# Patient Record
Sex: Female | Born: 1960 | Race: White | Hispanic: No | Marital: Single | Smoking: Never smoker
Health system: Southern US, Community
[De-identification: ages and names within clinical notes are randomized; demographics above are authoritative.]

---

## 1998-08-23 ENCOUNTER — Other Ambulatory Visit: Admission: RE | Admit: 1998-08-23 | Discharge: 1998-08-23 | Payer: Self-pay | Admitting: Obstetrics and Gynecology

## 1999-05-13 ENCOUNTER — Encounter: Admission: RE | Admit: 1999-05-13 | Discharge: 1999-05-13 | Payer: Self-pay | Admitting: Sports Medicine

## 1999-06-13 ENCOUNTER — Encounter: Admission: RE | Admit: 1999-06-13 | Discharge: 1999-06-13 | Payer: Self-pay | Admitting: Sports Medicine

## 1999-09-26 ENCOUNTER — Other Ambulatory Visit: Admission: RE | Admit: 1999-09-26 | Discharge: 1999-09-26 | Payer: Self-pay | Admitting: Obstetrics and Gynecology

## 2001-06-28 ENCOUNTER — Encounter: Admission: RE | Admit: 2001-06-28 | Discharge: 2001-06-28 | Payer: Self-pay | Admitting: Sports Medicine

## 2002-02-11 ENCOUNTER — Encounter: Admission: RE | Admit: 2002-02-11 | Discharge: 2002-02-11 | Payer: Self-pay | Admitting: Sports Medicine

## 2002-09-16 ENCOUNTER — Encounter: Payer: Self-pay | Admitting: Sports Medicine

## 2002-09-16 ENCOUNTER — Encounter: Admission: RE | Admit: 2002-09-16 | Discharge: 2002-09-16 | Payer: Self-pay | Admitting: Sports Medicine

## 2002-10-07 ENCOUNTER — Encounter: Admission: RE | Admit: 2002-10-07 | Discharge: 2002-10-07 | Payer: Self-pay | Admitting: Sports Medicine

## 2002-11-04 ENCOUNTER — Encounter: Admission: RE | Admit: 2002-11-04 | Discharge: 2002-11-04 | Payer: Self-pay | Admitting: Sports Medicine

## 2003-02-10 ENCOUNTER — Encounter: Admission: RE | Admit: 2003-02-10 | Discharge: 2003-02-10 | Payer: Self-pay | Admitting: Sports Medicine

## 2003-07-15 ENCOUNTER — Ambulatory Visit (HOSPITAL_COMMUNITY): Admission: RE | Admit: 2003-07-15 | Discharge: 2003-07-15 | Payer: Self-pay | Admitting: *Deleted

## 2004-09-06 ENCOUNTER — Other Ambulatory Visit: Admission: RE | Admit: 2004-09-06 | Discharge: 2004-09-06 | Payer: Self-pay | Admitting: Family Medicine

## 2004-10-07 ENCOUNTER — Ambulatory Visit: Payer: Self-pay | Admitting: Sports Medicine

## 2005-05-26 ENCOUNTER — Ambulatory Visit: Payer: Self-pay | Admitting: Sports Medicine

## 2005-07-04 ENCOUNTER — Ambulatory Visit: Payer: Self-pay | Admitting: Sports Medicine

## 2006-01-26 ENCOUNTER — Ambulatory Visit: Payer: Self-pay | Admitting: Sports Medicine

## 2007-12-04 ENCOUNTER — Telehealth: Payer: Self-pay | Admitting: *Deleted

## 2008-01-22 ENCOUNTER — Ambulatory Visit: Payer: Self-pay | Admitting: Sports Medicine

## 2008-01-22 DIAGNOSIS — M25559 Pain in unspecified hip: Secondary | ICD-10-CM | POA: Insufficient documentation

## 2008-01-22 DIAGNOSIS — M5430 Sciatica, unspecified side: Secondary | ICD-10-CM

## 2008-01-24 ENCOUNTER — Telehealth (INDEPENDENT_AMBULATORY_CARE_PROVIDER_SITE_OTHER): Payer: Self-pay | Admitting: *Deleted

## 2009-08-16 ENCOUNTER — Ambulatory Visit: Payer: Self-pay | Admitting: Sports Medicine

## 2010-02-16 ENCOUNTER — Ambulatory Visit: Payer: Self-pay | Admitting: Sports Medicine

## 2010-02-16 DIAGNOSIS — M79609 Pain in unspecified limb: Secondary | ICD-10-CM

## 2010-07-08 ENCOUNTER — Ambulatory Visit: Admission: RE | Admit: 2010-07-08 | Discharge: 2010-07-08 | Payer: Self-pay | Admitting: Orthopedic Surgery

## 2010-07-08 ENCOUNTER — Ambulatory Visit: Payer: Self-pay | Admitting: Vascular Surgery

## 2010-07-08 ENCOUNTER — Encounter (INDEPENDENT_AMBULATORY_CARE_PROVIDER_SITE_OTHER): Payer: Self-pay | Admitting: Orthopedic Surgery

## 2010-11-17 ENCOUNTER — Other Ambulatory Visit
Admission: RE | Admit: 2010-11-17 | Discharge: 2010-11-17 | Payer: Self-pay | Source: Home / Self Care | Admitting: Family Medicine

## 2011-01-05 NOTE — Assessment & Plan Note (Signed)
Summary: 4PM APPT/ R CALF PAIN   Vital Signs:  Patient profile:   50 year old female BP sitting:   117 / 81  Vitals Entered By: Lillia Pauls CMA (February 16, 2010 4:04 PM)  History of Present Illness: Training for marathon for past several months. Ran 25 miles weekly at the time. Insidious onset of non-radiating mid-right calf pain 5 months ago.  Stopped running for a few weeks. Resume running for weekly mileage of 25. Experiences pain  ~11 miles into run.  No back/groin/thigh/ankle/foot pain. N paresthesias/incontinence. Still taking neurontin (600mg  once daily?; does not recall dose) for hx of back pain. Will compete in marathon in 4 days.   PMH-FH-SH reviewed for relevance  Physical Exam  General:  Well-developed,well-nourished,in no acute distress; alert,appropriate and cooperative throughout examination Msk:  BACK: FROM without pain. No ttp or apparent deformity.  HIPS/KNEES/ANKLES/FEET: Full ROM/strength. No calf atrophy/swelling/discoloration. Pain and spasm of mid-lateral right calf, of a few seconds duration, on plantarflexion against resistance. Equivocal re: respective ttp. No fib ttp/deformity. 2+ DTR except for 1+ right PT DTR. Intact sensation.  * No limp. Pulses:  2+ pt/dp pulses. Neurologic:  Negative sitting/supine SLR bilaterally. Additional Exam:  Musculoskeletal Ultrasound: Longitudinal and transverse views of the right calf revealed the following:  Normal fibula head. Intact musculature without apparent masses, defects, or abnormal fluid collection.    Impression & Recommendations:  Problem # 1:  CALF PAIN (ICD-729.5) Picture most consistent with calf strain. Lumbar radiculopathy less likely.  - body helix calf sleeve only during exericse. - Daily eceentric exercises, demonstrated to the patient. - voltaren gel. - Use her own gel heel cups in running shoes. - rtc in 6 wks.   Orders: Garment,belt,sleeve or other covering ,elastic or  similar stretch (E4540)  Complete Medication List: 1)  Neurontin 600 Mg Tabs (Gabapentin) .... Take one tablet twice a day 2)  Voltaren 1 % Gel (Diclofenac sodium) .Marland Kitchen.. 1 gm into affected area qid  Other Orders: [place] LIMITED ([id number]) Prescriptions: VOLTAREN 1 % GEL (DICLOFENAC SODIUM) 1 gm into affected area QID  #100 gm x 1   Entered and Authorized by:   Valarie Merino MD   Signed by:   Valarie Merino MD on 02/16/2010   Method used:   Print then Give to Patient   RxID:   [id number]

## 2011-12-10 ENCOUNTER — Emergency Department (HOSPITAL_COMMUNITY)
Admission: EM | Admit: 2011-12-10 | Discharge: 2011-12-10 | Disposition: A | Discharge status: Home / Self Care | Payer: BC Managed Care – PPO | Attending: Emergency Medicine | Admitting: Emergency Medicine

## 2011-12-10 DIAGNOSIS — R630 Anorexia: Secondary | ICD-10-CM | POA: Insufficient documentation

## 2011-12-10 DIAGNOSIS — R112 Nausea with vomiting, unspecified: Secondary | ICD-10-CM | POA: Insufficient documentation

## 2011-12-10 DIAGNOSIS — K529 Noninfective gastroenteritis and colitis, unspecified: Secondary | ICD-10-CM

## 2011-12-10 DIAGNOSIS — R197 Diarrhea, unspecified: Secondary | ICD-10-CM | POA: Insufficient documentation

## 2011-12-10 DIAGNOSIS — K5289 Other specified noninfective gastroenteritis and colitis: Secondary | ICD-10-CM | POA: Insufficient documentation

## 2011-12-10 LAB — COMPREHENSIVE METABOLIC PANEL
ALT: 25 U/L (ref 0–35)
AST: 35 U/L (ref 0–37)
Albumin: 4.3 g/dL (ref 3.5–5.2)
Alkaline Phosphatase: 43 U/L (ref 39–117)
BUN: 18 mg/dL (ref 6–23)
CO2: 22 mEq/L (ref 19–32)
Calcium: 9.6 mg/dL (ref 8.4–10.5)
Chloride: 103 mEq/L (ref 96–112)
Creatinine, Ser: 0.83 mg/dL (ref 0.50–1.10)
GFR calc Af Amer: >90 mL/min (ref >90)
GFR calc non Af Amer: 81 mL/min — ABNORMAL LOW (ref >90)
Glucose, Bld: 110 mg/dL — ABNORMAL HIGH (ref 70–99)
Potassium: 4 mEq/L (ref 3.5–5.1)
Sodium: 137 mEq/L (ref 135–145)
Total Bilirubin: 0.6 mg/dL (ref 0.3–1.2)
Total Protein: 7.7 g/dL (ref 6.0–8.3)

## 2011-12-10 LAB — CBC
HCT: 43.1 % (ref 36.0–46.0)
Hemoglobin: 15.2 g/dL — ABNORMAL HIGH (ref 12.0–15.0)
MCH: 31.1 pg (ref 26.0–34.0)
MCV: 88.1 fL (ref 78.0–100.0)
Platelets: 197 10*3/uL (ref 150–400)
RBC: 4.89 MIL/uL (ref 3.87–5.11)

## 2011-12-10 LAB — DIFFERENTIAL
Eosinophils Absolute: 0.1 10*3/uL (ref 0.0–0.7)
Eosinophils Relative: 1 % (ref 0–5)
Lymphocytes Relative: 6 % — ABNORMAL LOW (ref 12–46)
Lymphs Abs: 0.6 10*3/uL — ABNORMAL LOW (ref 0.7–4.0)
Monocytes Absolute: 0.6 10*3/uL (ref 0.1–1.0)
Monocytes Relative: 6 % (ref 3–12)

## 2011-12-10 MED ORDER — SODIUM CHLORIDE 0.9 % IV BOLUS (SEPSIS)
1000.0000 mL | Freq: Once | INTRAVENOUS | Status: AC
  Administered 2011-12-10: 1000 mL via INTRAVENOUS

## 2011-12-10 MED ORDER — ONDANSETRON HCL 4 MG/2ML IJ SOLN
4.0000 mg | Freq: Once | INTRAMUSCULAR | Status: AC
  Administered 2011-12-10: 4 mg via INTRAVENOUS
  Filled 2011-12-10: qty 2

## 2011-12-10 MED ORDER — ONDANSETRON 8 MG PO TBDP: ORAL_TABLET | ORAL | Status: AC

## 2011-12-10 NOTE — ED Provider Notes (Signed)
History     CSN: [id number]  Arrival date & time 12/10/11  4098   First MD Initiated Contact with Patient 12/10/11 2050      Chief Complaint  Patient presents with  . Nausea  . Diarrhea    (Consider location/radiation/quality/duration/timing/severity/associated sxs/prior treatment) HPI Comments: Patient here with a day history of nausea, vomiting and diarrhea since 1400 today - states no abdominal pain, fever, chills, reports recently visited sick mother who was also ill - no other problems.  Denies vaginal discharge, bleeding, pregnancy.  Patient is a 51 y.o. female presenting with diarrhea. The history is provided by the patient. No language interpreter was used.  Diarrhea The primary symptoms include nausea, vomiting and diarrhea. Primary symptoms do not include fever, weight loss, abdominal pain, melena, hematemesis, jaundice, hematochezia, dysuria, myalgias, arthralgias or rash. The illness began today. The onset was gradual. The problem has not changed since onset. The illness is also significant for anorexia. The illness does not include chills, dysphagia, odynophagia, bloating, constipation, tenesmus, back pain or itching.    History reviewed. No pertinent past medical history.  History reviewed. No pertinent past surgical history.  No family history on file.  History  Substance Use Topics  . Smoking status: Never Smoker   . Smokeless tobacco: Not on file  . Alcohol Use: No    OB History    Grav Para Term Preterm Abortions TAB SAB Ect Mult Living                  Review of Systems  Constitutional: Negative for fever, chills and weight loss.  Gastrointestinal: Positive for nausea, vomiting, diarrhea and anorexia. Negative for dysphagia, abdominal pain, constipation, melena, hematochezia, bloating, hematemesis and jaundice.  Genitourinary: Negative for dysuria.  Musculoskeletal: Negative for myalgias, back pain and arthralgias.  Skin: Negative for itching and  rash.  All other systems reviewed and are negative.    Allergies  Review of patient's allergies indicates no known allergies.  Home Medications   Current Outpatient Rx  Name Route Sig Dispense Refill  . ONDANSETRON 8 MG PO TBDP  8mg  ODT q4 hours prn nausea 8 tablet 0    BP 101/68  Pulse 59  Resp 21  Ht 5\' 1"  (1.549 m)  Wt 135 lb (61.236 kg)  BMI 25.51 kg/m2  SpO2 100%  LMP 12/06/2011  Physical Exam  Nursing note and vitals reviewed. Constitutional: She is oriented to person, place, and time. She appears well-developed and well-nourished.  HENT:  Head: Normocephalic and atraumatic.  Right Ear: External ear normal.  Left Ear: External ear normal.  Nose: Nose normal.  Mouth/Throat: Oropharynx is clear and moist. No oropharyngeal exudate.  Eyes: Conjunctivae are normal. Pupils are equal, round, and reactive to light. No scleral icterus.  Neck: Normal range of motion. Neck supple.  Cardiovascular: Normal rate, regular rhythm and normal heart sounds.  Exam reveals no gallop and no friction rub.   No murmur heard. Pulmonary/Chest: Effort normal and breath sounds normal. No respiratory distress. She exhibits no tenderness.  Abdominal: Soft. Bowel sounds are normal. She exhibits no distension. There is no tenderness.  Musculoskeletal: Normal range of motion.  Lymphadenopathy:    She has no cervical adenopathy.  Neurological: She is alert and oriented to person, place, and time.  Skin: Skin is warm and dry.  Psychiatric: She has a normal mood and affect. Her behavior is normal. Judgment and thought content normal.    ED Course  Procedures (including critical care time)  Labs Reviewed  COMPREHENSIVE METABOLIC PANEL - Abnormal; Notable for the following:    Glucose, Bld 110 (*)    GFR calc non Af Amer 81 (*)    All other components within normal limits  CBC - Abnormal; Notable for the following:    Hemoglobin 15.2 (*)    All other components within normal limits    DIFFERENTIAL - Abnormal; Notable for the following:    Neutrophils Relative 87 (*)    Neutro Abs 9.1 (*)    Lymphocytes Relative 6 (*)    Lymphs Abs 0.6 (*)    All other components within normal limits  URINALYSIS, ROUTINE W REFLEX MICROSCOPIC  PREGNANCY, URINE   No results found.   1. Gastroenteritis       MDM  Patient reports improvement in symptoms after a liter of fluids and zofran - able to keep down po's - would like to be discharged.        Izola Price [place], [place] 12/10/11 [phone]

## 2011-12-10 NOTE — ED Notes (Signed)
Denies pain, denies nausea, no vomiting, pt states " i felt so much better"

## 2011-12-10 NOTE — ED Notes (Signed)
Pt presented from triage c/o nausea, vomiting x 5 episodes, diarrhea x 4 episodes today, no active vomiting on arrival, (+) mild nausea (-) diarrhea, abdominal pain scale 4/10, waiting physician evaluation

## 2011-12-10 NOTE — ED Notes (Signed)
Vomited x 2 episodes, small amount, had loose stool x 1 episode large amount

## 2011-12-10 NOTE — ED Provider Notes (Signed)
History     CSN: [id number]  Arrival date & time 12/10/11  9528   First MD Initiated Contact with Patient 12/10/11 2050      Chief Complaint  Patient presents with  . Nausea  . Diarrhea    (Consider location/radiation/quality/duration/timing/severity/associated sxs/prior treatment) HPI  History reviewed. No pertinent past medical history.  History reviewed. No pertinent past surgical history.  No family history on file.  History  Substance Use Topics  . Smoking status: Never Smoker   . Smokeless tobacco: Not on file  . Alcohol Use: No    OB History    Grav Para Term Preterm Abortions TAB SAB Ect Mult Living                  Review of Systems  Allergies  Review of patient's allergies indicates no known allergies.  Home Medications   Current Outpatient Rx  Name Route Sig Dispense Refill  . ONDANSETRON 8 MG PO TBDP  8mg  ODT q4 hours prn nausea 8 tablet 0    BP 101/68  Pulse 59  Resp 21  Ht 5\' 1"  (1.549 m)  Wt 135 lb (61.236 kg)  BMI 25.51 kg/m2  SpO2 100%  LMP 12/06/2011  Physical Exam  ED Course  Procedures (including critical care time)  Labs Reviewed  COMPREHENSIVE METABOLIC PANEL - Abnormal; Notable for the following:    Glucose, Bld 110 (*)    GFR calc non Af Amer 81 (*)    All other components within normal limits  CBC - Abnormal; Notable for the following:    Hemoglobin 15.2 (*)    All other components within normal limits  DIFFERENTIAL - Abnormal; Notable for the following:    Neutrophils Relative 87 (*)    Neutro Abs 9.1 (*)    Lymphocytes Relative 6 (*)    Lymphs Abs 0.6 (*)    All other components within normal limits  URINALYSIS, ROUTINE W REFLEX MICROSCOPIC  PREGNANCY, URINE   No results found.   1. Gastroenteritis       MDM  Recheck at 2300. Feeling much better. Says ready to go home.    Medical screening examination/treatment/procedure(s) were conducted as a shared visit with non-physician practitioner(s) and  myself.  I personally evaluated the patient during the encounter    Donnetta Hutching, MD 12/10/11 2315

## 2011-12-10 NOTE — ED Notes (Signed)
Denies fever, abdominal pain and chill- denies urinary problems

## 2011-12-11 NOTE — ED Provider Notes (Signed)
Medical screening examination/treatment/procedure(s) were conducted as a shared visit with non-physician practitioner(s) and myself.  I personally evaluated the patient during the encounter.  No acute abd.  Feeling better p ivf  Donnetta Hutching, MD 12/11/11 0008

## 2012-01-25 ENCOUNTER — Encounter: Payer: Self-pay | Admitting: Sports Medicine

## 2012-01-25 ENCOUNTER — Ambulatory Visit (INDEPENDENT_AMBULATORY_CARE_PROVIDER_SITE_OTHER): Payer: BC Managed Care – PPO | Admitting: Sports Medicine

## 2012-01-25 VITALS — BP 156/82 | HR 65 | Ht 61.0 in | Wt 140.0 lb

## 2012-01-25 DIAGNOSIS — M67952 Unspecified disorder of synovium and tendon, left thigh: Secondary | ICD-10-CM

## 2012-01-25 DIAGNOSIS — M25559 Pain in unspecified hip: Secondary | ICD-10-CM

## 2012-01-25 DIAGNOSIS — M25552 Pain in left hip: Secondary | ICD-10-CM

## 2012-01-25 DIAGNOSIS — M76899 Other specified enthesopathies of unspecified lower limb, excluding foot: Secondary | ICD-10-CM

## 2012-01-25 NOTE — Patient Instructions (Addendum)
Hip abductors strengthening exercises Aleeve 2 tab orally in AM and PM Alternate biking and elliptical Use green insoles F/U in 4 weeks

## 2012-01-25 NOTE — Progress Notes (Signed)
  Subjective:    Patient ID: Alejandra Preston, female    DOB: 18-Oct-1961, 51 y.o.   MRN: [id number]  HPI  Alejandra Preston is a pleasant 50 years the patient went today complaining of left lateral hip pain since October. She denies any injury to her left hip. She is a runner. She is running average of 25 miles a week. State that her pain starts at half of her running distance , but is not debilitating and she can run through it. It will take a couple days for the pain  completely go away. She denies any numbness or tingling. He denies any radiation of the pain distally.  She has custom orthotics done somewhere else which are rigid. She does not like to runn with them.  Patient Active Problem List  Diagnoses  . HIP PAIN, LEFT, CHRONIC  . SCIATICA, LEFT  . CALF PAIN  . Left hip pain  . Tendinopathy of left gluteus medius   No current outpatient prescriptions on file prior to visit.   No Known Allergies   Review of Systems  Constitutional: Negative for fever, chills, diaphoresis and fatigue.  Musculoskeletal: Negative for back pain, joint swelling, arthralgias and gait problem.  Neurological: Negative for dizziness, tremors, weakness and numbness.       Objective:   Physical Exam  Constitutional: She is oriented to person, place, and time. She appears well-developed and well-nourished.       BP 156/82  Pulse 65  Ht 5\' 1"  (1.549 m)  Wt 140 lb (63.504 kg)  BMI 26.45 kg/m2   Pulmonary/Chest: Effort normal.  Musculoskeletal:       Left hip with intact skin, no swelling, no hematomas Full range of motion. No pain with internal or external rotation. No TTP over the great trochanteric area.  Turner's location on the lateral iliac fossa of the gluteus medius level There is mild weakness of her hip up at abductors B/L. No leg length  discrepancy Neurovascularly intact   Neurological: She is alert and oriented to person, place, and time.  Skin: Skin is warm. No rash noted. No erythema.    Psychiatric: She has a normal mood and affect. Her behavior is normal. Thought content normal.          Assessment & Plan:   1. Left hip pain   2. Tendinopathy of left gluteus medius    Hip abductors strengthening exercises Aleeve 2 tab orally in AM and PM Alternate biking and elliptical Use green insoles F/U in 4 weeks

## 2012-02-21 ENCOUNTER — Ambulatory Visit: Payer: BC Managed Care – PPO | Admitting: Sports Medicine

## 2016-02-28 ENCOUNTER — Other Ambulatory Visit: Payer: Self-pay | Admitting: Family Medicine

## 2016-02-28 DIAGNOSIS — N183 Chronic kidney disease, stage 3 (moderate): Secondary | ICD-10-CM

## 2016-03-02 ENCOUNTER — Ambulatory Visit
Admission: RE | Admit: 2016-03-02 | Discharge: 2016-03-02 | Disposition: A | Discharge status: Home / Self Care | Payer: 59 | Source: Ambulatory Visit | Attending: Family Medicine | Admitting: Family Medicine

## 2016-03-02 DIAGNOSIS — N183 Chronic kidney disease, stage 3 (moderate): Secondary | ICD-10-CM

## 2017-03-09 ENCOUNTER — Emergency Department (HOSPITAL_COMMUNITY)
Admission: EM | Admit: 2017-03-09 | Discharge: 2017-03-09 | Disposition: A | Discharge status: Home / Self Care | Payer: 59 | Attending: Emergency Medicine | Admitting: Emergency Medicine

## 2017-03-09 ENCOUNTER — Encounter (HOSPITAL_COMMUNITY): Payer: Self-pay | Admitting: Emergency Medicine

## 2017-03-09 DIAGNOSIS — Z203 Contact with and (suspected) exposure to rabies: Secondary | ICD-10-CM | POA: Insufficient documentation

## 2017-03-09 DIAGNOSIS — Y939 Activity, unspecified: Secondary | ICD-10-CM | POA: Diagnosis not present

## 2017-03-09 DIAGNOSIS — Y929 Unspecified place or not applicable: Secondary | ICD-10-CM | POA: Diagnosis not present

## 2017-03-09 DIAGNOSIS — W540XXA Bitten by dog, initial encounter: Secondary | ICD-10-CM | POA: Insufficient documentation

## 2017-03-09 DIAGNOSIS — Z23 Encounter for immunization: Secondary | ICD-10-CM | POA: Diagnosis not present

## 2017-03-09 DIAGNOSIS — S61452A Open bite of left hand, initial encounter: Secondary | ICD-10-CM | POA: Insufficient documentation

## 2017-03-09 DIAGNOSIS — Y999 Unspecified external cause status: Secondary | ICD-10-CM | POA: Diagnosis not present

## 2017-03-09 NOTE — Discharge Instructions (Signed)
Watch carefully for any sign of infection  

## 2017-03-09 NOTE — ED Provider Notes (Signed)
[facility]-EMERGENCY DEPT Provider Note   CSN: [id number] Arrival date & time: 03/09/17  1548  By signing my name below, I, Doreatha Martin, attest that this documentation has been prepared under the direction and in the presence of Jerek Meulemans K. Sulma Ruffino, PA-C. Electronically Signed: Doreatha Martin, ED Scribe. 03/09/17. 4:32 PM.    History   Chief Complaint Chief Complaint  Patient presents with  . Rabies Injection    HPI Alejandra Preston is a 56 y.o. female who presents to the Emergency Department requesting rabies shots after dog bite to the left hand that occurred yesterday. Pt had the bite repaired yesterday at [facility] after the initial injury and was not given rabies shots at that time. Animal control has custody of the dog, which is a house pet, and is observing the dog for 10 days. The dog's vaccinations are out of date. Per pt, her PCP advised her to return to the ED to obtain rabies vaccinations. Pt denies fever, additional injuries.    The history is provided by the patient. No language interpreter was used.    History reviewed. No pertinent past medical history.  Patient Active Problem List   Diagnosis Date Noted  . Left hip pain 01/25/2012  . Tendinopathy of left gluteus medius 01/25/2012  . CALF PAIN 02/16/2010  . HIP PAIN, LEFT, CHRONIC 01/22/2008  . SCIATICA, LEFT 01/22/2008    History reviewed. No pertinent surgical history.  OB History    No data available       Home Medications    Prior to Admission medications   Not on File    Family History No family history on file.  Social History Social History  Substance Use Topics  . Smoking status: Never Smoker  . Smokeless tobacco: Never Used  . Alcohol use No     Allergies   Patient has no known allergies.   Review of Systems Review of Systems  Constitutional: Negative for fever.  Skin: Positive for wound.  All other systems reviewed and are negative.    Physical Exam Updated Vital  Signs BP (!) 135/92 (BP Location: Right Arm)   Pulse (!) 55   Temp 97.5 F (36.4 C) (Oral)   Resp 18   Wt 140 lb (63.5 kg)   SpO2 100%   BMI 26.45 kg/m   Physical Exam  Constitutional: She appears well-developed and well-nourished.  HENT:  Head: Normocephalic.  Eyes: Conjunctivae are normal.  Cardiovascular: Normal rate.   Pulmonary/Chest: Effort normal. No respiratory distress.  Abdominal: She exhibits no distension.  Musculoskeletal: Normal range of motion.  V-shaped sutured laceration to the left dorsal hand.   Neurological: She is alert.  Skin: Skin is warm and dry.  Psychiatric: She has a normal mood and affect. Her behavior is normal.  Nursing note and vitals reviewed.    ED Treatments / Results   DIAGNOSTIC STUDIES: Oxygen Saturation is 100% on RA, normal by my interpretation.    COORDINATION OF CARE: 4:17 PM Discussed treatment plan with pt at bedside and pt agreed to plan.    Procedures Procedures (including critical care time)  Medications Ordered in ED Medications - No data to display   Initial Impression / Assessment and Plan / ED Course  I have reviewed the triage vital signs and the nursing notes.      Alejandra Preston presents to the ED for evaluation of need for rabies Patient advised to follow up with PCP. Patient appears stable for discharge at this  time. Return precautions discussed and outlined in discharge paperwork. Patient is agreeable to plan.     Final Clinical Impressions(s) / ED Diagnoses   Final diagnoses:  Dog bite, initial encounter  Need for rabies vaccination    New Prescriptions New Prescriptions   No medications on file    Pt counseled on rabies vaccine.  Pt will wait as animal is in quarantine and is well. An After Visit Summary was printed and given to the patient.   Lonia Skinner [place], PA-C 03/09/17 1810    Cathren Laine, MD 03/09/17 2007

## 2017-03-09 NOTE — ED Triage Notes (Signed)
Pt got bit by dog yesterday and had left hand stitched up. Pt was not given rabies shot and is requesting them. Pt was given antibiotics.

## 2018-09-23 ENCOUNTER — Ambulatory Visit (INDEPENDENT_AMBULATORY_CARE_PROVIDER_SITE_OTHER): Payer: 59

## 2018-09-23 ENCOUNTER — Other Ambulatory Visit: Payer: Self-pay | Admitting: Podiatry

## 2018-09-23 ENCOUNTER — Encounter: Payer: Self-pay | Admitting: Podiatry

## 2018-09-23 ENCOUNTER — Ambulatory Visit (INDEPENDENT_AMBULATORY_CARE_PROVIDER_SITE_OTHER): Payer: 59 | Admitting: Podiatry

## 2018-09-23 VITALS — BP 119/72 | HR 55

## 2018-09-23 DIAGNOSIS — M25571 Pain in right ankle and joints of right foot: Secondary | ICD-10-CM | POA: Diagnosis not present

## 2018-09-23 DIAGNOSIS — M7750 Other enthesopathy of unspecified foot: Secondary | ICD-10-CM

## 2018-09-23 DIAGNOSIS — M7751 Other enthesopathy of right foot: Secondary | ICD-10-CM

## 2018-09-23 DIAGNOSIS — M79671 Pain in right foot: Secondary | ICD-10-CM

## 2018-09-23 MED ORDER — MELOXICAM 7.5 MG PO TABS: 7.5000 mg | ORAL_TABLET | Freq: Every day | ORAL | 0 refills | Status: AC

## 2018-09-23 NOTE — Patient Instructions (Signed)

## 2018-09-23 NOTE — Progress Notes (Signed)
Subjective:   Patient ID: Alejandra Preston, female   DOB: 57 y.o.   MRN: [id number]   HPI 57 year old female presents the office with concerns of right flank pain which is been ongoing for about 3 weeks.  She states that she has pain in the top of foot as well as the arch of her foot at times.  She thinks this started when she was cycling and she was changing the position of her seat and started after being her right leg and then shortly after her right foot started to hurt.  She states that she saw her foot in certain positions when she has discomfort.  No injury that she can recall no swelling.  No numbness or tingling.   Review of Systems  All other systems reviewed and are negative.  History reviewed. No pertinent past medical history.  History reviewed. No pertinent surgical history.   Current Outpatient Medications:  .  acetaminophen-codeine (TYLENOL #3) 300-30 MG tablet, TAKE 1 TABLET BY MOUTH EVERY 4 TO 6 HOURS AS NEEDED FOR PAIN, Disp: , Rfl: 0 .  Ascorbic Acid (VITAMIN C) 100 MG tablet, Take 100 mg by mouth daily., Disp: , Rfl:  .  gabapentin (NEURONTIN) 600 MG tablet, Take 600 mg by mouth 3 (three) times daily., Disp: , Rfl:  .  Misc Natural Products (ESTROVEN ENERGY) TABS, Take 1 tablet by mouth daily., Disp: , Rfl:  .  meloxicam (MOBIC) 7.5 MG tablet, Take 1 tablet (7.5 mg total) by mouth daily., Disp: 30 tablet, Rfl: 0  No Known Allergies       Objective:  Physical Exam  General: AAO x3, NAD  Dermatological: Skin is warm, dry and supple bilateral. Nails x 10 are well manicured; remaining integument appears unremarkable at this time. There are no open sores, no preulcerative lesions, no rash or signs of infection present.  Vascular: Dorsalis Pedis artery and Posterior Tibial artery pedal pulses are 2/4 bilateral with immedate capillary fill time. Pedal hair growth present. No varicosities and no lower extremity edema present bilateral. There is no pain with calf  compression, swelling, warmth, erythema.   Neruologic: Grossly intact via light touch bilateral. Protective threshold with Semmes Wienstein monofilament intact to all pedal sites bilateral.  Negative Tinel sign.  Musculoskeletal: Upon nonweightbearing exam she has a cavus foot type however upon weightbearing she has a decrease in medial arch height.  Subjectively she is getting tenderness along the extensor tendons on the anterior aspect of the ankle as well as the peroneal tendon however she is not expensing any pain today.  She shows me when she flexes her ankle in certain directions especially as discomfort.  There is no area pinpoint bony tenderness or pain to vibratory sensation.  Achilles tendon, plantar fascial pain to be intact.  Muscular strength 5/5 in all groups tested bilateral.  Gait: Unassisted, Nonantalgic.      Assessment:   Tendinitis right ankle     Plan:  -Treatment options discussed including all alternatives, risks, and complications -Etiology of symptoms were discussed -X-rays were obtained and reviewed with the patient.  There is no evidence of acute fracture or stress fracture identified today. -Unfortunately think that a lot of her symptoms due to overuse given the positioning change in her seat while cycling.  She had other issues with her right lower extremity as this is a sequela of this.  We discussed icing as well as anti-inflammatories as generalized stretching, rehab exercises.  We also discussed trying to continue  with the orthotics that she already has.  Vivi Barrack DPM

## 2018-10-21 ENCOUNTER — Ambulatory Visit: Payer: 59 | Admitting: Podiatry

## 2020-03-04 ENCOUNTER — Ambulatory Visit: Payer: 59 | Attending: Internal Medicine

## 2020-03-04 DIAGNOSIS — Z23 Encounter for immunization: Secondary | ICD-10-CM

## 2020-03-04 NOTE — Progress Notes (Signed)
   Covid-19 Vaccination Clinic  Name:  Alejandra Preston    MRN: [id number] DOB: 10/19/1961  03/04/2020  [name] was observed post Covid-19 immunization for 15 minutes without incident. She was provided with Vaccine Information Sheet and instruction to access the V-Safe system.   [name] was instructed to call 911 with any severe reactions post vaccine: Marland Kitchen Difficulty breathing  . Swelling of face and throat  . A fast heartbeat  . A bad rash all over body  . Dizziness and weakness   Immunizations Administered    Name Date Dose VIS Date Route   Pfizer COVID-19 Vaccine 03/04/2020  9:43 AM 0.3 mL 11/14/2019 Intramuscular   Manufacturer: [employer], [employer]   Lot: [id number]   NDC: [address]-7

## 2020-03-30 ENCOUNTER — Ambulatory Visit: Payer: 59 | Attending: Internal Medicine

## 2020-03-30 DIAGNOSIS — Z23 Encounter for immunization: Secondary | ICD-10-CM

## 2020-03-30 NOTE — Progress Notes (Signed)
   Covid-19 Vaccination Clinic  Name:  Alejandra Preston    MRN: [id number] DOB: 1961-03-09  03/30/2020  [name] was observed post Covid-19 immunization for 15 minutes without incident. She was provided with Vaccine Information Sheet and instruction to access the V-Safe system.   [name] was instructed to call 911 with any severe reactions post vaccine: Marland Kitchen Difficulty breathing  . Swelling of face and throat  . A fast heartbeat  . A bad rash all over body  . Dizziness and weakness   Immunizations Administered    Name Date Dose VIS Date Route   Pfizer COVID-19 Vaccine 03/30/2020  9:50 AM 0.3 mL 01/28/2019 Intramuscular   Manufacturer: [employer], [employer]   Lot: [id number]   NDC: [address]-3

## 2020-06-24 ENCOUNTER — Encounter (INDEPENDENT_AMBULATORY_CARE_PROVIDER_SITE_OTHER): Payer: Managed Care, Other (non HMO) | Admitting: Ophthalmology

## 2020-06-24 ENCOUNTER — Other Ambulatory Visit: Payer: Self-pay

## 2020-06-24 DIAGNOSIS — H2513 Age-related nuclear cataract, bilateral: Secondary | ICD-10-CM

## 2020-06-24 DIAGNOSIS — H43813 Vitreous degeneration, bilateral: Secondary | ICD-10-CM | POA: Diagnosis not present

## 2020-06-24 DIAGNOSIS — H4423 Degenerative myopia, bilateral: Secondary | ICD-10-CM | POA: Diagnosis not present

## 2021-01-06 ENCOUNTER — Ambulatory Visit (INDEPENDENT_AMBULATORY_CARE_PROVIDER_SITE_OTHER): Payer: No Typology Code available for payment source | Admitting: Sports Medicine

## 2021-01-06 ENCOUNTER — Other Ambulatory Visit: Payer: Self-pay

## 2021-01-06 VITALS — BP 110/78 | Ht 61.0 in | Wt 135.0 lb

## 2021-01-06 DIAGNOSIS — G5701 Lesion of sciatic nerve, right lower limb: Secondary | ICD-10-CM | POA: Diagnosis not present

## 2021-01-06 MED ORDER — GABAPENTIN 600 MG PO TABS: 600.0000 mg | ORAL_TABLET | Freq: Two times a day (BID) | ORAL | 0 refills | Status: AC

## 2021-01-06 NOTE — Progress Notes (Signed)
Office Visit Note   Patient: Alejandra Preston           Date of Birth: 1961-05-07           MRN: [id number] Visit Date: 01/06/2021 Requested by: Sigmund Hazel, MD [address] [place],  [place] [address] PCP: Sigmund Hazel, MD  Subjective: CC: Right gluteal pain  HPI: 60 year old female presenting to clinic today with concerns of 2 weeks of right posterior hamstring/gluteal pain.  Patient states that she is a runner, and did not initially notice this pain with running, however she went for a long drive and felt significant pain in her right gluteal area.  This pain radiated down the back of her leg and into her right knee, but did not extend further into the ankle.  She has a history of sciatic nerve pain, and is worried that this may be the same.  She denies any back pain with the onset of the symptoms.  Since her symptoms started, she has noticed increased tension in her right hamstring when compared to the left.  She feels as though her hamstring has been tight and sore when she is running, with occasional cramping even at rest.  She says she continues to have pain with prolonged sitting, though it is most noticeable in the hamstrings.  Note current gabapentin use is for hot flashes Used this > 10 years ago for sciatic irritation from piriformis              ROS:   All other systems were reviewed and are negative.  Objective: Vital Signs: BP 110/78   Ht 5\' 1"  (1.549 m)   Wt 135 lb (61.2 kg)   BMI 25.51 kg/m   Physical Exam:  General:  Alert and oriented, in no acute distress. Pulm:  Breathing unlabored. Psy:  Normal mood, congruent affect. Skin: Right leg with no bruising, rashes, or erythema.  Overlying skin intact. Right leg exam:  Normal gait.  No varus or valgus deformity of the knee, appropriate Q angle of the hip.  No pelvic asymmetry.  Full range of motion of hip with no pain.  Symmetric internal and external rotation. Strength: 5 out of 5 strength with hip flexion,  abduction and adduction as well as knee flexion and extension, and ankle dorsi/plantarflexion.  Palpation: Endorses tenderness to palpation around the right ischial tuberosity.  No specific tenderness within the gluteal or piriformis musculature.  Some tenderness along the distal hamstrings as well.  No tenderness to palpation over the greater trochanteric area.  No tenderness over bilateral SI joints, or lumbar paraspinal muscles.  No anterior hip flexor tender points.  Supine exam: No pain with logroll.  FADIR produces no deep pain. FABER produces no posterior or groin pain.   Ober's test with no significant IT band inflexibility.  Nobles compression negative Popliteal angle appropriate.  Resisted knee flexion does cause some "cramping" within right hamstring.  Trendelenburg's with no obvious pelvic stabilizer weakness.  Good gluteal strength bilaterally.   Imaging: No results found.  Assessment & Plan: 60 year old female presenting to clinic today with concerns of posterior right gluteal and hamstring pain, which feels concerningly consistent with previous sciatic nerve irritation in the past.  Suspect she has sciatic impingement within the hamstring musculature itself.  -Discussed increasing her previously prescribed gabapentin for suspected nerve irritation.  (Currently prescribed this medication for menopausal hot flashes) -Discussed wearing hamstring compression sleeve or tight leggings when running to help keep hamstrings warm.  -Educated  on asking exercises as well as gentle hamstring stretches she can perform to help improve her symptoms and release the nerve impingement. -Return to clinic in 3 to 4 weeks for reevaluation. -Patient expresses understanding with plan, she had no further questions or concerns today.    Procedures: No procedures performed      I observed and examined the patient with the [facility] resident and agree with assessment and plan.  Note reviewed and modified  by me. Vedia Coffer

## 2021-02-17 ENCOUNTER — Ambulatory Visit: Payer: No Typology Code available for payment source | Admitting: Sports Medicine

## 2021-02-28 ENCOUNTER — Other Ambulatory Visit: Payer: Self-pay | Admitting: Family Medicine

## 2021-02-28 DIAGNOSIS — R202 Paresthesia of skin: Secondary | ICD-10-CM

## 2021-03-10 ENCOUNTER — Other Ambulatory Visit: Payer: Self-pay

## 2021-03-10 ENCOUNTER — Ambulatory Visit
Admission: RE | Admit: 2021-03-10 | Discharge: 2021-03-10 | Disposition: A | Discharge status: Home / Self Care | Payer: No Typology Code available for payment source | Source: Ambulatory Visit | Attending: Family Medicine | Admitting: Family Medicine

## 2021-03-10 DIAGNOSIS — R202 Paresthesia of skin: Secondary | ICD-10-CM

## 2021-03-10 MED ORDER — GADOBENATE DIMEGLUMINE 529 MG/ML IV SOLN
13.0000 mL | Freq: Once | INTRAVENOUS | Status: AC | PRN
  Administered 2021-03-10: 13 mL via INTRAVENOUS

## 2021-07-26 ENCOUNTER — Ambulatory Visit
Admission: RE | Admit: 2021-07-26 | Discharge: 2021-07-26 | Disposition: A | Discharge status: Home / Self Care | Payer: No Typology Code available for payment source | Source: Ambulatory Visit | Attending: Family Medicine | Admitting: Family Medicine

## 2021-07-26 ENCOUNTER — Other Ambulatory Visit: Payer: Self-pay | Admitting: Family Medicine

## 2021-07-26 ENCOUNTER — Other Ambulatory Visit: Payer: Self-pay

## 2021-07-26 DIAGNOSIS — R202 Paresthesia of skin: Secondary | ICD-10-CM

## 2021-08-29 ENCOUNTER — Other Ambulatory Visit: Payer: Self-pay | Admitting: Family Medicine

## 2021-08-29 DIAGNOSIS — R202 Paresthesia of skin: Secondary | ICD-10-CM

## 2021-09-22 ENCOUNTER — Ambulatory Visit
Admission: RE | Admit: 2021-09-22 | Discharge: 2021-09-22 | Disposition: A | Discharge status: Home / Self Care | Payer: No Typology Code available for payment source | Source: Ambulatory Visit | Attending: Family Medicine | Admitting: Family Medicine

## 2021-09-22 DIAGNOSIS — R202 Paresthesia of skin: Secondary | ICD-10-CM

## 2022-04-03 ENCOUNTER — Ambulatory Visit (INDEPENDENT_AMBULATORY_CARE_PROVIDER_SITE_OTHER): Payer: Self-pay | Admitting: Physician Assistant

## 2022-04-03 ENCOUNTER — Encounter: Payer: Self-pay | Admitting: Physician Assistant

## 2022-04-03 ENCOUNTER — Ambulatory Visit (INDEPENDENT_AMBULATORY_CARE_PROVIDER_SITE_OTHER): Payer: Self-pay

## 2022-04-03 DIAGNOSIS — M25522 Pain in left elbow: Secondary | ICD-10-CM

## 2022-04-03 NOTE — Progress Notes (Signed)
? ?Office Visit Note ?  ?Patient: Alejandra Preston           ?Date of Birth: [birth year]           ?MRN: [id number] ?Visit Date: 04/03/2022 ?             ?Requested by: Sigmund Hazel, MD ?[address] ?[place],  [place] [address] ?PCP: Sigmund Hazel, MD ? ?Chief Complaint  ?Patient presents with  ? Left Elbow - Follow-up  ? ? ? ? ?HPI: ?Patient presents today with a chief complaint of left elbow pain.  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  When she turned to move her bag her elbow hit up against a metal plate.  She denies any bruising at the time.  Initially she had significant pain especially when she tries to place her elbow on a table.  She denies any paresthesias any weakness any instability.  She admits today that it is gotten much better and she can now put her elbow down.  She just wanted to make sure there was nothing further that needed to be done ? ?Assessment & Plan: ?Visit Diagnoses:  ?1. Pain in left elbow   ? ? ?Plan: Left elbow pain x1 month.  She does have a small chip fracture on the medial side of the ulna but not really tender there.  She has good stability of the ligaments.  She has no real pain today.  I think she will be fine she may follow-up as needed ? ?Follow-Up Instructions: No follow-ups on file.  ? ?Ortho Exam ? ?Patient is alert, oriented, no adenopathy, well-dressed, normal affect, normal respiratory effort. ?Left elbow she has no swelling no redness.  Distal strength is 5 out of 5 no radiation of pain or paresthesias into the hand.  She has excellent biceps triceps strength.  Good varus valgus stability.  Cannot appreciate any particular area of tenderness today ? ?Imaging: ?XR Elbow Complete Left (3+View) ? ?Result Date: 04/03/2022 ?Radiographs of her left elbow were reviewed today.  No sail sign.  Well-maintained alignment no degenerative changes she does have a small nonacute chip off the medial  side of the ulna.  ?No images are attached to the encounter. ? ?Labs: ?No results found for: HGBA1C, ESRSEDRATE, CRP, LABURIC, REPTSTATUS, GRAMSTAIN, CULT, LABORGA ? ? ?Lab Results  ?Component Value Date  ? ALBUMIN 4.3 12/10/2011  ? ? ?No results found for: MG ?No results found for: VD25OH ? ?No results found for: PREALBUMIN ? ?  Latest Ref Rng & Units 12/10/2011  ?  7:54 PM  ?CBC EXTENDED  ?WBC 4.0 - 10.5 K/uL 10.4    ?RBC 3.87 - 5.11 MIL/uL 4.89    ?Hemoglobin 12.0 - 15.0 g/dL 31.4    ?HCT 36.0 - 46.0 % 43.1    ?Platelets 150 - 400 K/uL 197    ?NEUT# 1.7 - 7.7 K/uL 9.1    ?Lymph# 0.7 - 4.0 K/uL 0.6    ? ? ? ?There is no height or weight on file to calculate BMI. ? ?Orders:  ?Orders Placed This Encounter  ?Procedures  ? XR Elbow Complete Left (3+View)  ? ?No orders of the defined types were placed in this encounter. ? ? ? Procedures: ?No procedures performed ? ?Clinical Data: ?No additional findings. ? ?ROS: ? ?All other systems negative, except as noted in the HPI. ?Review of Systems ? ?Objective: ?  Vital Signs: There were no vitals taken for this visit. ? ?Specialty Comments:  ?No specialty comments available. ? ?PMFS History: ?Patient Active Problem List  ? Diagnosis Date Noted  ? Left hip pain 01/25/2012  ? 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 CALF PAIN 02/16/2010  ? HIP PAIN, LEFT, CHRONIC 01/22/2008  ? SCIATICA, LEFT 01/22/2008  ? ?History reviewed. No pertinent past medical history.  ?History reviewed. No pertinent family history.  ?History reviewed. No pertinent surgical history. ?Social History  ? ?Occupational History  ? Not on file  ?Tobacco Use  ? Smoking status: Never  ? Smokeless tobacco: Never  ?Substance and Sexual Activity  ? Alcohol use: No  ? Drug use: No  ? Sexual activity: Not on file  ? ? ? ? ? ?

## 2023-06-12 IMAGING — MR MR CERVICAL SPINE W/O CM
6 series · 39 of 48 positions shown · non-contrast
Comparison: None.

CLINICAL DATA: Paresthesia; technologist note states bilateral hand
numbness with burning sensation

EXAM:
MRI CERVICAL SPINE WITHOUT CONTRAST
TECHNIQUE: Multiplanar, multisequence MR imaging of the cervical spine was
performed. No intravenous contrast was administered.

[Series 2: T2 · sagittal · 3.0mm · 0.41mm/px · 7 of 17 slices shown (1 of 2)]
[im 1/17]
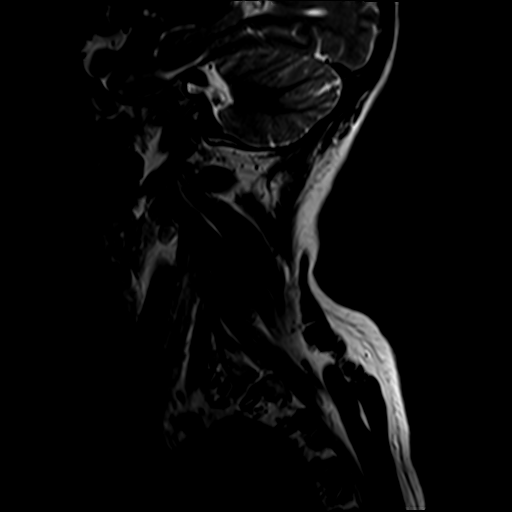
[im 3/17]
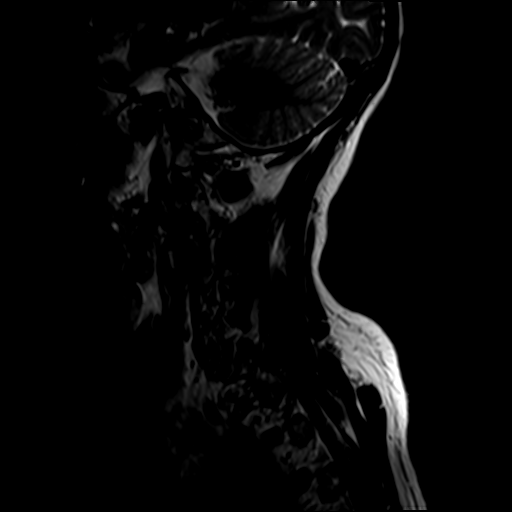
[im 6/17]
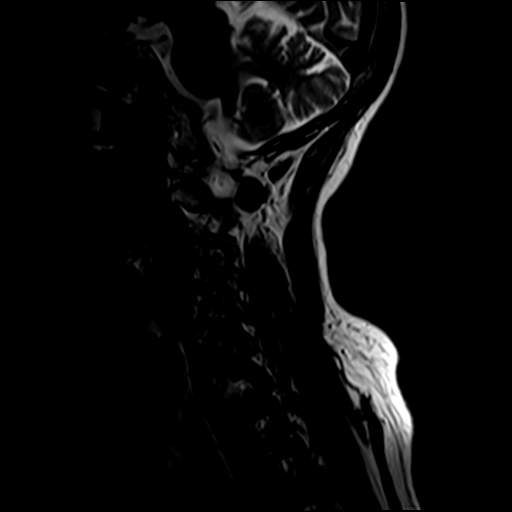
[im 9/17]
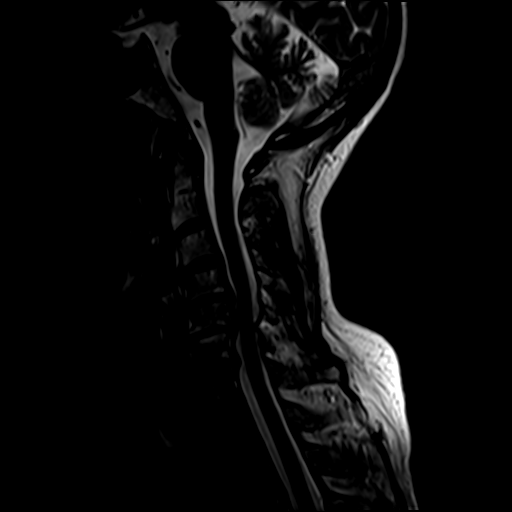
[im 11/17]
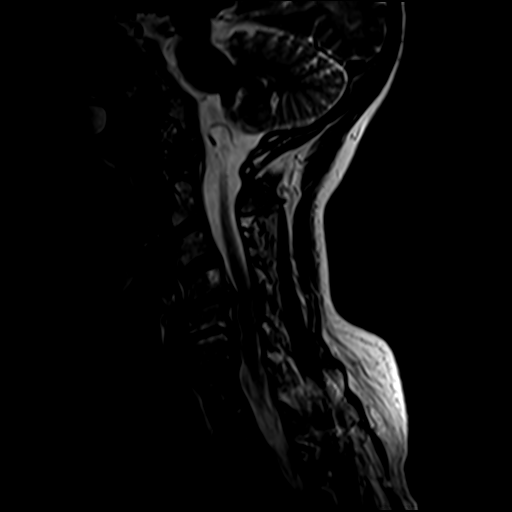
[im 14/17]
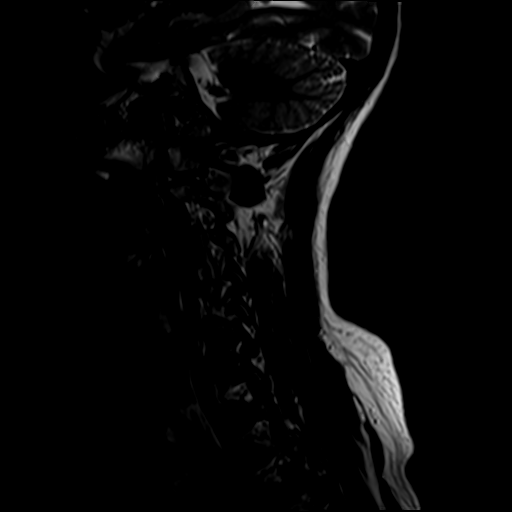
[im 17/17]
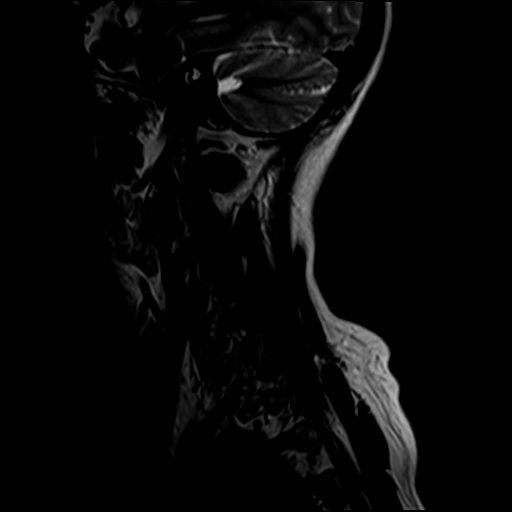

[Series 3: STIR · sagittal · 3.0mm · 0.82mm/px · 7 of 17 slices shown]
[im 1/17]
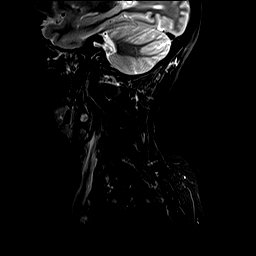
[im 3/17]
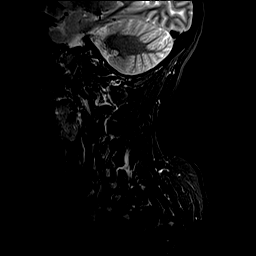
[im 6/17]
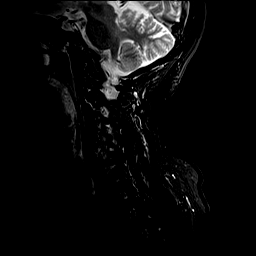
[im 9/17]
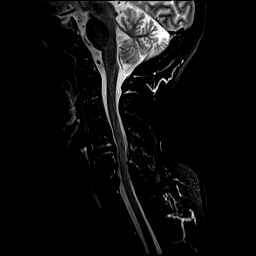
[im 11/17]
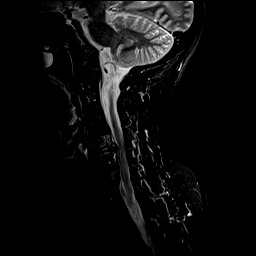
[im 14/17]
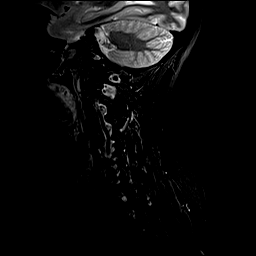
[im 17/17]
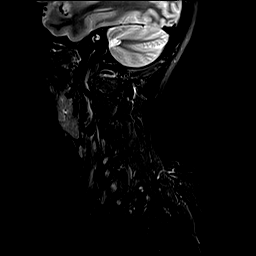

[Series 4: T1 · sagittal · 3.0mm · 0.82mm/px · 7 of 17 slices shown (1 of 2)]
[im 1/17]
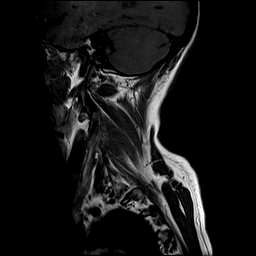
[im 3/17]
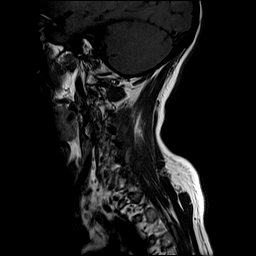
[im 6/17]
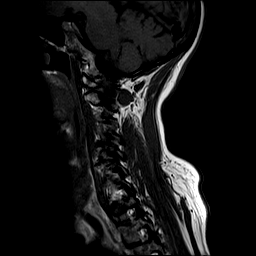
[im 9/17]
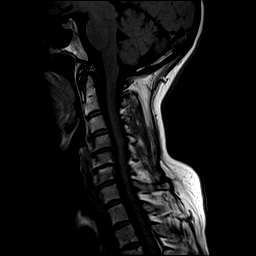
[im 11/17]
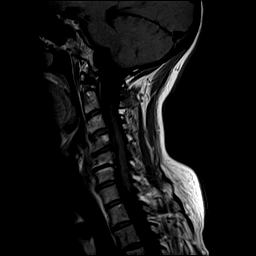
[im 14/17]
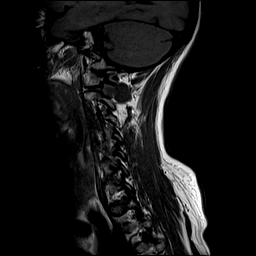
[im 17/17]
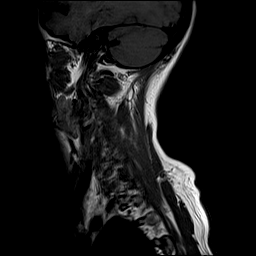

[Series 5: T2 · axial · 3.0mm · 0.70mm/px · z∈[-100,-15]mm · 8 of 24 slices shown (2 of 2)]
[im 1/24]
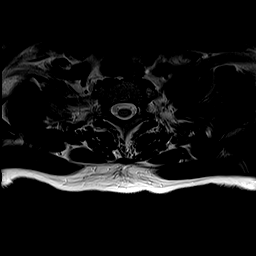
[im 3/24]
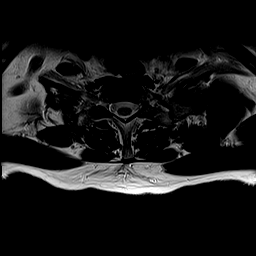
[im 8/24]
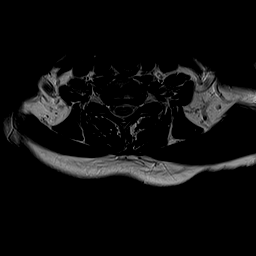
[im 11/24]
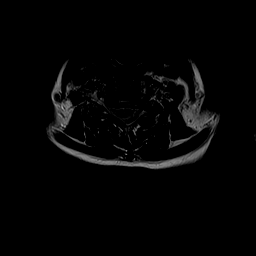
[im 13/24]
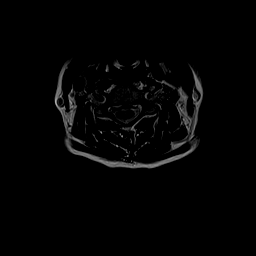
[im 16/24]
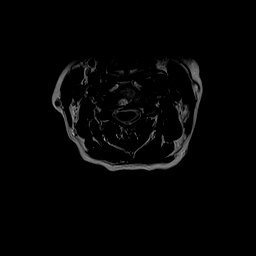
[im 21/24]
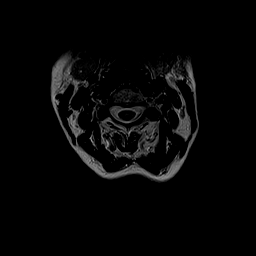
[im 24/24]
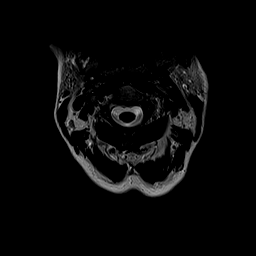

[Series 6: GRE · axial · 3.0mm · 0.35mm/px · z∈[-100,-74]mm · 3 of 24 slices shown]
[im 1/24]
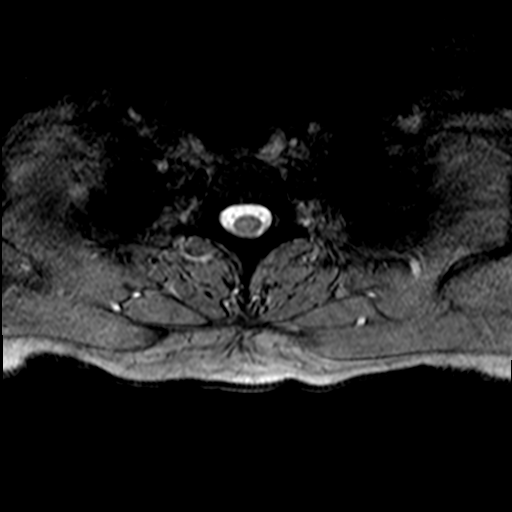
[im 3/24]
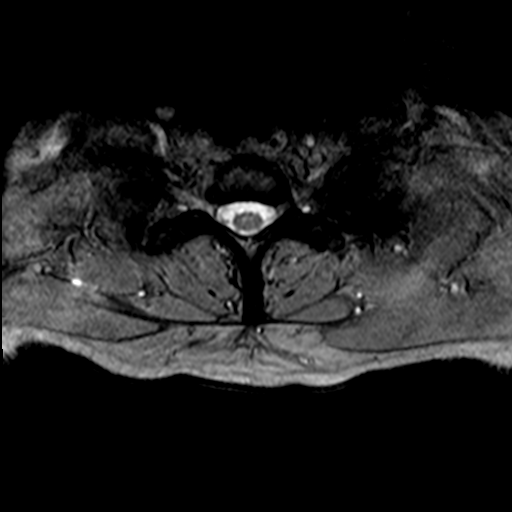
[im 8/24]
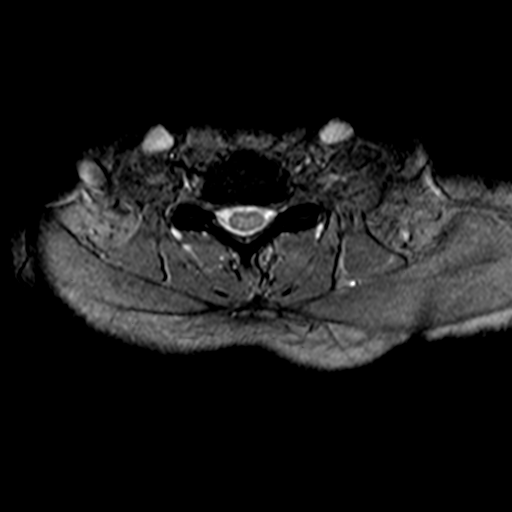

[Series 7: T1 · sagittal · 3.0mm · 0.82mm/px · 7 of 17 slices shown (2 of 2)]
[im 1/17]
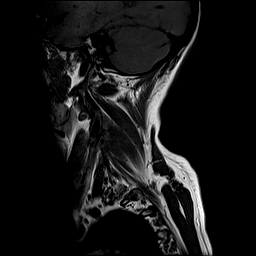
[im 3/17]
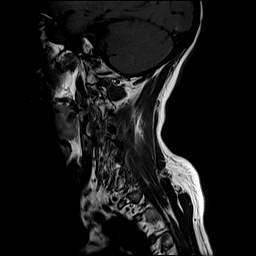
[im 6/17]
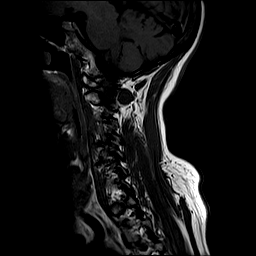
[im 9/17]
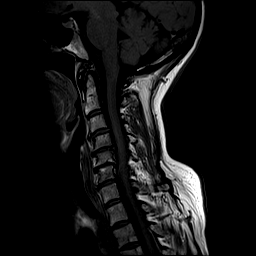
[im 11/17]
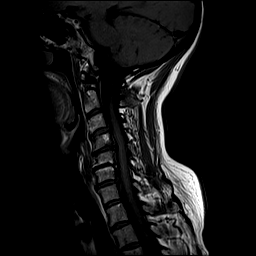
[im 14/17]
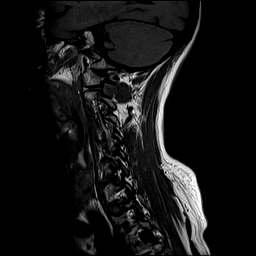
[im 17/17]
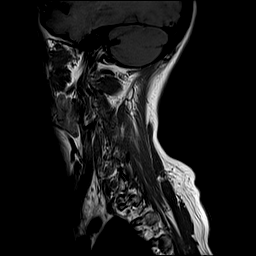

[39 of 48 positions shown; findings below may reference images not displayed]

FINDINGS: Alignment: Mild retrolisthesis at C4-C5 and C5-C6.

Vertebrae: Degenerative plate irregularity at the above levels.
Degenerative endplate marrow changes are present. There is a small
vertebral body hemangioma at C4.

Cord: No abnormal signal.

Posterior Fossa, vertebral arteries, paraspinal tissues:
Unremarkable.

Disc levels:

C2-C3:  No canal or foraminal stenosis.

C3-C4:  No canal or foraminal stenosis.

C4-C5: Disc bulge with endplate osteophytes. Uncovertebral
hypertrophy. Mild canal stenosis. Moderate to marked right and
marked left foraminal stenosis.

C5-C6: Disc bulge with endplate osteophytes. Uncovertebral
hypertrophy. Mild canal stenosis. Moderate right and marked left
foraminal stenosis.

C6-C7: Disc bulge with endplate osteophytes. Uncovertebral
hypertrophy. No canal stenosis. Moderate foraminal stenosis.

C7-T1:  No canal or foraminal stenosis.
IMPRESSION: Multilevel degenerative changes as detailed above. No high-grade
canal stenosis. Foraminal narrowing is greatest from C4-C5 through
C6-C7.

## 2024-02-20 ENCOUNTER — Ambulatory Visit (INDEPENDENT_AMBULATORY_CARE_PROVIDER_SITE_OTHER): Payer: Managed Care, Other (non HMO) | Admitting: Diagnostic Neuroimaging

## 2024-02-20 ENCOUNTER — Encounter: Payer: Self-pay | Admitting: Diagnostic Neuroimaging

## 2024-02-20 VITALS — BP 119/62 | HR 64 | Ht 61.0 in | Wt 134.0 lb

## 2024-02-20 DIAGNOSIS — R209 Unspecified disturbances of skin sensation: Secondary | ICD-10-CM

## 2024-02-20 DIAGNOSIS — R449 Unspecified symptoms and signs involving general sensations and perceptions: Secondary | ICD-10-CM

## 2024-02-20 NOTE — Progress Notes (Signed)
 [facility]  PATIENT: Alejandra Preston DOB: Apr 14, 1961  REFERRING CLINICIAN: Jarrett Soho, PA-C HISTORY FROM: Patient REASON FOR VISIT: New consult   HISTORICAL  CHIEF COMPLAINT:  Chief Complaint  Patient presents with   Extremity Weakness    RM 7 alone Pt is well, reports she is having numbness and cold sensation in LE. She also reports tingling, burning and warm sensation in her whole body. Symptoms have progressed in the last 3 yrs.     HISTORY OF PRESENT ILLNESS:   63 year old female here for evaluation of abnormal sensations.  Since 2022 has had intermittent episodes of feeling hot sensations throughout her body, face, arms, legs, torso, sometimes associate with stinging and pinching sensation, migratory and anywhere in her body, typically in the evening when she is trying to rest to go to sleep.  Getting up and moving around seems to really alleviate the symptoms.  She does not have symptoms during the daytime.  She is very active and runs about 26 miles per week and these symptoms do not affect her while she is exercising.  Has been on gabapentin in the past for some other nerve related problems including sciatica and neck pain.  Currently on 600 mg in the morning and 1200 mg at bedtime (~730pm).   REVIEW OF SYSTEMS: Full 14 system review of systems performed and negative with exception of: as per HPI.  ALLERGIES: No Known Allergies  HOME MEDICATIONS: Outpatient Medications Prior to Visit  Medication Sig Dispense Refill   gabapentin (NEURONTIN) 600 MG tablet Take 1 tablet (600 mg total) by mouth 2 (two) times daily. (Patient taking differently: Take 600 mg by mouth 3 (three) times daily.) 60 tablet 0   meloxicam (MOBIC) 7.5 MG tablet Take 1 tablet (7.5 mg total) by mouth daily. 30 tablet 0   Misc Natural Products (ESTROVEN ENERGY) TABS Take 1 tablet by mouth daily.     Multiple Vitamins-Minerals (PRESERVISION AREDS 2+MULTI VIT) CAPS as directed  Orally Daily     acetaminophen-codeine (TYLENOL #3) 300-30 MG tablet TAKE 1 TABLET BY MOUTH EVERY 4 TO 6 HOURS AS NEEDED FOR PAIN  0   Ascorbic Acid (VITAMIN C) 100 MG tablet Take 100 mg by mouth daily.     No facility-administered medications prior to visit.    PAST MEDICAL HISTORY: History reviewed. No pertinent past medical history.  PAST SURGICAL HISTORY: History reviewed. No pertinent surgical history.  FAMILY HISTORY: Family History  Problem Relation Age of Onset   Cancer Mother    Cancer Sister     SOCIAL HISTORY: Social History   Socioeconomic History   Marital status: Single    Spouse name: Not on file   Number of children: Not on file   Years of education: Not on file   Highest education level: Not on file  Occupational History   Not on file  Tobacco Use   Smoking status: Never   Smokeless tobacco: Never  Vaping Use   Vaping status: Never Used  Substance and Sexual Activity   Alcohol use: No   Drug use: No   Sexual activity: Not on file  Other Topics Concern   Not on file  Social History Narrative   Not on file   Social Drivers of Health   Financial Resource Strain: Not on file  Food Insecurity: Not on file  Transportation Needs: Not on file  Physical Activity: Not on file  Stress: Not on file  Social Connections: Not on file  Intimate  Partner Violence: Not on file     PHYSICAL EXAM  GENERAL EXAM/CONSTITUTIONAL: Vitals:  Vitals:   02/20/24 1116  BP: 119/62  Pulse: 64  Weight: 134 lb (60.8 kg)  Height: 5\' 1"  (1.549 m)   Body mass index is 25.32 kg/m. Wt Readings from Last 3 Encounters:  02/20/24 134 lb (60.8 kg)  01/06/21 135 lb (61.2 kg)  03/09/17 140 lb (63.5 kg)   Patient is in no distress; well developed, nourished and groomed; neck is supple  CARDIOVASCULAR: Examination of carotid arteries is normal; no carotid bruits Regular rate and rhythm, no murmurs Examination of peripheral vascular system by observation and palpation  is normal  EYES: Ophthalmoscopic exam of optic discs and posterior segments is normal; no papilledema or hemorrhages No results found.  MUSCULOSKELETAL: Gait, strength, tone, movements noted in Neurologic exam below  NEUROLOGIC: MENTAL STATUS:      No data to display         awake, alert, oriented to person, place and time recent and remote memory intact normal attention and concentration language fluent, comprehension intact, naming intact fund of knowledge appropriate  CRANIAL NERVE:  2nd - no papilledema on fundoscopic exam 2nd, 3rd, 4th, 6th - pupils equal and reactive to light, visual fields full to confrontation, extraocular muscles intact, no nystagmus 5th - facial sensation symmetric 7th - facial strength symmetric 8th - hearing intact 9th - palate elevates symmetrically, uvula midline 11th - shoulder shrug symmetric 12th - tongue protrusion midline  MOTOR:  normal bulk and tone, full strength in the BUE, BLE  SENSORY:  normal and symmetric to light touch, temperature, vibration  COORDINATION:  finger-nose-finger, fine finger movements normal  REFLEXES:  deep tendon reflexes TRACE and symmetric  GAIT/STATION:  narrow based gait     DIAGNOSTIC DATA (LABS, IMAGING, TESTING) - I reviewed patient records, labs, notes, testing and imaging myself where available.  Lab Results  Component Value Date   WBC 10.4 12/10/2011   HGB 15.2 (H) 12/10/2011   HCT 43.1 12/10/2011   MCV 88.1 12/10/2011   PLT 197 12/10/2011      Component Value Date/Time   NA 137 12/10/2011 1958   K 4.0 12/10/2011 1958   CL 103 12/10/2011 1958   CO2 22 12/10/2011 1958   GLUCOSE 110 (H) 12/10/2011 1958   BUN 18 12/10/2011 1958   CREATININE 0.83 12/10/2011 1958   CALCIUM 9.6 12/10/2011 1958   PROT 7.7 12/10/2011 1958   ALBUMIN 4.3 12/10/2011 1958   AST 35 12/10/2011 1958   ALT 25 12/10/2011 1958   ALKPHOS 43 12/10/2011 1958   BILITOT 0.6 12/10/2011 1958   GFRNONAA 81 (L)  12/10/2011 1958   GFRAA >90 12/10/2011 1958   No results found for: "CHOL", "HDL", "LDLCALC", "LDLDIRECT", "TRIG", "CHOLHDL" No results found for: "HGBA1C" No results found for: "VITAMINB12" No results found for: "TSH"   09/22/2021 MRI cervical spine Multilevel degenerative changes as detailed above. No high-grade canal stenosis. Foraminal narrowing is greatest from C4-C5 through C6-C7.   ASSESSMENT AND PLAN  63 y.o. year old female here with:  Dx:  1. Abnormal mouth sensation   2. Abnormal arm sensation   3. Abnormal sensation of leg     PLAN:  INTERMITTENT BURNING, STINGING PAIN SENSATIONS (evening; worse with laying down; relieved with movement; some RLS symptoms) - check iron studies panel (per PCP) - follow up with PCP or ob/gyn re: possible post-menopausal symptoms - continue gabapentin 600mg  three times a day; take evening dose  earlier, about ~2 hours before bedtime - may increase gabapentin up to 900mg  three times a day   Return for pending if symptoms worsen or fail to improve.    Suanne Marker, MD 02/20/2024, 11:37 AM Certified in Neurology, Neurophysiology and Neuroimaging  [facility] [address], [address] [place], [place] [address] [phone]

## 2024-02-20 NOTE — Patient Instructions (Addendum)
  INTERMITTENT BURNING, STINGING PAIN SENSATIONS (evening; worse with laying down; relieved with movement; some RLS symptoms) - check iron studies panel (per PCP) - continue gabapentin 600mg  three times a day; take evening dose earlier, about ~2 hours before bedtime - may increase gabapentin up to 900mg  three times a day

## 2024-07-24 ENCOUNTER — Ambulatory Visit (INDEPENDENT_AMBULATORY_CARE_PROVIDER_SITE_OTHER): Payer: Self-pay | Admitting: Sports Medicine

## 2024-07-24 ENCOUNTER — Other Ambulatory Visit: Payer: Self-pay

## 2024-07-24 ENCOUNTER — Encounter: Payer: Self-pay | Admitting: Sports Medicine

## 2024-07-24 VITALS — BP 130/84 | Ht 61.0 in | Wt 134.0 lb

## 2024-07-24 DIAGNOSIS — M25551 Pain in right hip: Secondary | ICD-10-CM | POA: Diagnosis not present

## 2024-07-24 NOTE — Progress Notes (Signed)
 PCP: Katina Pfeiffer, PA-C  Subjective:   HPI: Patient is a 63 y.o. female here for right lateral hip pain.  She describes onset of right lateral hip pain in May after running a half marathon at an increased pace.  She points to the iliac crest when asked to pinpoint her discomfort.  Pain develops after running roughly 4-5 miles, not present until that time.  She has tried taking time off from running on multiple occasions, approximately 10 weeks in total, without sustained improvement in pain.  Most recently, she returned to running at the beginning of August and says that pain developed within 2 days.  She has tried multiple rounds of home physical therapy exercises without sustained pain relief.  She denies pain along the lumbar spine or at the right knee.  History reviewed. No pertinent past medical history.  Current Outpatient Medications on File Prior to Visit  Medication Sig Dispense Refill   gabapentin  (NEURONTIN ) 600 MG tablet Take 1 tablet (600 mg total) by mouth 2 (two) times daily. (Patient taking differently: Take 600 mg by mouth 3 (three) times daily.) 60 tablet 0   meloxicam  (MOBIC ) 7.5 MG tablet Take 1 tablet (7.5 mg total) by mouth daily. 30 tablet 0   Misc Natural Products (ESTROVEN ENERGY) TABS Take 1 tablet by mouth daily.     Multiple Vitamins-Minerals (PRESERVISION AREDS 2+MULTI VIT) CAPS as directed Orally Daily     No current facility-administered medications on file prior to visit.    History reviewed. No pertinent surgical history.  No Known Allergies  BP 130/84 (BP Location: Left Arm, Patient Position: Sitting)   Ht 5' 1 (1.549 m)   Wt 134 lb (60.8 kg)   BMI 25.32 kg/m      01/06/2021    1:32 PM  [facility] Adult Exercise  Frequency of aerobic exercise (# of days/week) 4  Average time in minutes 45  Frequency of strengthening activities (# of days/week) 2        No data to display              Objective:  Physical Exam:  Gen:  NAD, comfortable in exam room  Right hip No deformity. Full range of motion with 5/5 strength. There is tenderness to palpation over the iliac crest. Neurovascularly intact distally. Negative logroll Negative faber, fadir, piriformis stretches, and hackey sack test.  [facility] Ultrasound Right Hip Limited musculoskeletal ultrasound of the right hip shows hypoechoic changes and calcification over the iliac crest suggestive of partial tendon avulsion at the iliac crest.   Ultrasound and interpretation by Helene B. Harvey, MD and Manus Fireman, MD.   Assessment & Plan:  1.  Right lateral hip pain She endorses pain over the iliac crest after running more than 4 miles.  Pain has not improved despite taking multiple weeks off of running.  Limited musculoskeletal ultrasound today shows inflammatory changes and calcification over the iliac crest, suggestive of partial tendon avulsion.  Likely partial tear of the oblique.  Treatment options reviewed.  We discussed that physical therapy remains the mainstay of treatment.  With this in mind she was given an oblique series to perform at home.  Additional treatment options were reviewed, including extracorporeal shockwave therapy.  Risk and benefits were reviewed and she elected to proceed with shockwave today.  Recommend completing an additional 2 treatments with a gradual return to running after shockwave session #3.  She should gradually increase distance on a weekly basis.  We will tentatively  plan for follow-up in 6 weeks and will repeat ultrasound. -Home physical therapy -oblique series -ESWT session #1 completed today, follow-up next week for session #2 -Follow-up in 6 weeks for reassessment  Procedure: ECSWT Indications: Partial tendon avulsion-right iliac crest   Procedure Details Consent: Risks of procedure as well as the alternatives and risks of each were explained to the patient.  Written consent for procedure obtained. Time Out: Verified patient  identification, verified procedure, site was marked, verified correct patient position, medications/allergies/relevent history reviewed.  The area was cleaned with alcohol swab.     The right iliac crest was targeted for Extracorporeal shockwave therapy.    Preset: Trochanteric bursitis Power Level: 60 mJ initially, then increased to 70 mJ Frequency: 10 Hz Impulse/cycles: 2000 Head size: Large   Patient tolerated procedure well without immediate complications

## 2024-07-31 ENCOUNTER — Ambulatory Visit (INDEPENDENT_AMBULATORY_CARE_PROVIDER_SITE_OTHER): Payer: Self-pay | Admitting: Sports Medicine

## 2024-07-31 DIAGNOSIS — M25551 Pain in right hip: Secondary | ICD-10-CM

## 2024-07-31 DIAGNOSIS — G8929 Other chronic pain: Secondary | ICD-10-CM | POA: Insufficient documentation

## 2024-07-31 NOTE — Assessment & Plan Note (Signed)
 Trial of 4 to 6 ESWT sessions and reevaluat

## 2024-07-31 NOTE — Progress Notes (Signed)
 ESWT # 2 Indication is a fascial separation at the RT iliac crest between obliques and Glut. Med  Head size large Power 70 mJ Impulses: 2400 Freq. 10  Patient tolerated this well. We will repeat US  after 4 sessions. RTC 1 week

## 2024-08-07 ENCOUNTER — Ambulatory Visit (INDEPENDENT_AMBULATORY_CARE_PROVIDER_SITE_OTHER): Payer: Self-pay | Admitting: Sports Medicine

## 2024-08-07 VITALS — Ht 61.0 in

## 2024-08-07 DIAGNOSIS — M25551 Pain in right hip: Secondary | ICD-10-CM

## 2024-08-07 DIAGNOSIS — G8929 Other chronic pain: Secondary | ICD-10-CM

## 2024-08-07 NOTE — Assessment & Plan Note (Signed)
 She has had 3 formal ESWT sessions and we will plan 5 total  US  to assess healing after 5th session  Not much pain to palpation at this point and up to 3 miles running

## 2024-08-07 NOTE — Progress Notes (Signed)
 ESWT # 3 Indication is a fascial separation at the RT iliac crest between obliques and Glut. Med   Head size large Power 70 mJ Impulses: 3000 Freq. 10      She tolerated this well.  Eswt for next 2 weeks After the second week repeat her US  diagnostic scan  PATRICE Haddock, MD

## 2024-08-15 ENCOUNTER — Ambulatory Visit (INDEPENDENT_AMBULATORY_CARE_PROVIDER_SITE_OTHER): Payer: Self-pay

## 2024-08-15 DIAGNOSIS — M25551 Pain in right hip: Secondary | ICD-10-CM

## 2024-08-15 NOTE — Progress Notes (Addendum)
 PCP: Katina Pfeiffer, PA-C  Subjective:   HPI: Patient is a 63 y.o. female here for 4th session of extracorporeal shock wave lithotripsy for right lateral hip pain.  Has received treatments every 2 weeks starting 07/24/2024 when she was initially diagnosed by Dr. 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  Noticed marked improvement after first ESWT treatment and resume running 2 weeks after first treatment pain-free.  She has continued running and still has not had recurrence of pain so far.  She ran 4 miles today pain-free.  She is hoping to get back to half marathon distance.  No new injuries since she was last here 2 weeks ago. SABRA   History reviewed. No pertinent past medical history.  Current Outpatient Medications on File Prior to Visit  Medication Sig Dispense Refill   gabapentin  (NEURONTIN ) 600 MG tablet Take 1 tablet (600 mg total) by mouth 2 (two) times daily. (Patient taking differently: Take 600 mg by mouth 3 (three) times daily.) 60 tablet 0   meloxicam  (MOBIC ) 7.5 MG tablet Take 1 tablet (7.5 mg total) by mouth daily. 30 tablet 0   Misc Natural Products (ESTROVEN ENERGY) TABS Take 1 tablet by mouth daily.     Multiple Vitamins-Minerals (PRESERVISION AREDS 2+MULTI VIT) CAPS as directed Orally Daily     No current facility-administered medications on file prior to visit.    History reviewed. No pertinent surgical history.  No Known Allergies  There were no vitals taken for this visit.     01/06/2021    1:32 PM  [facility]  Frequency of aerobic exercise (# of days/week) 4  Average time in minutes 45  Frequency of strengthening activities (# of days/week) 2        No data to display              Objective:  Physical Exam:  Gen: NAD, comfortable in exam room    Assessment & Plan:   #Right Lateral Hip Pain  ESWT # 4 today Indication is right lateral hip pain with a fascial  separation at the RT iliac crest between obliques and Glut. Med  Head size large Power 70 mJ Impulses: 3000 Freq. 10 She tolerated this well. She is up to running 4 miles at a time, completed run without pain this AM.   Eswt for next 2 weeks as well as repeat US  at that time to reassess healing

## 2024-08-21 ENCOUNTER — Ambulatory Visit (INDEPENDENT_AMBULATORY_CARE_PROVIDER_SITE_OTHER): Payer: Self-pay | Admitting: Sports Medicine

## 2024-08-21 VITALS — Ht 61.0 in | Wt 125.0 lb

## 2024-08-21 DIAGNOSIS — G8929 Other chronic pain: Secondary | ICD-10-CM

## 2024-08-21 DIAGNOSIS — M25551 Pain in right hip: Secondary | ICD-10-CM

## 2024-08-21 NOTE — Progress Notes (Signed)
 ESWR # 5  US  screen showed much less hypoechoic change over iliac crest  Head size large Impulses 3000 power 70 mJ Freq. 11 Location RT iliac crest  Running 5 miles without pain now If now pain can stop ESWT after this week.

## 2024-08-21 NOTE — Assessment & Plan Note (Signed)
 Did very well with no pain Additional ESWT optional

## 2024-08-28 ENCOUNTER — Ambulatory Visit: Payer: Self-pay | Admitting: Sports Medicine

## 2024-09-11 ENCOUNTER — Ambulatory Visit: Admitting: Podiatry

## 2024-09-11 ENCOUNTER — Encounter: Payer: Self-pay | Admitting: Podiatry

## 2024-09-11 DIAGNOSIS — G5793 Unspecified mononeuropathy of bilateral lower limbs: Secondary | ICD-10-CM

## 2024-09-12 NOTE — Progress Notes (Signed)
  Subjective:  Patient ID: Alejandra Preston, female    DOB: May 19, 1961,  MRN: [id number] HPI Chief Complaint  Patient presents with   Foot Pain    Plantar forefoot bilateral - episodes of cold and hot sensations x 3 months, concerns for neuropathy, Rx'd gabapentin  for hot flashes-taking 600mg  TID-not helping feet, also sensitivity medial hallux left    New Patient (Initial Visit)    Est pt 2019    63 y.o. female presents with the above complaint.   ROS: Denies fever chills nausea vomit muscle aches pains calf pain back pain chest pain shortness of breath.  No past medical history on file. No past surgical history on file.  Current Outpatient Medications:    gabapentin  (NEURONTIN ) 600 MG tablet, Take 1 tablet (600 mg total) by mouth 2 (two) times daily. (Patient taking differently: Take 600 mg by mouth 3 (three) times daily.), Disp: 60 tablet, Rfl: 0   meloxicam  (MOBIC ) 7.5 MG tablet, Take 1 tablet (7.5 mg total) by mouth daily., Disp: 30 tablet, Rfl: 0   Misc Natural Products (ESTROVEN ENERGY) TABS, Take 1 tablet by mouth daily., Disp: , Rfl:    Multiple Vitamins-Minerals (PRESERVISION AREDS 2+MULTI VIT) CAPS, as directed Orally Daily, Disp: , Rfl:   No Known Allergies Review of Systems Objective:  There were no vitals filed for this visit.  General: Well developed, nourished, in no acute distress, alert and oriented x3   Dermatological: Skin is warm, dry and supple bilateral. Nails x 10 are well maintained; remaining integument appears unremarkable at this time. There are no open sores, no preulcerative lesions, no rash or signs of infection present.  Vascular: Dorsalis Pedis artery and Posterior Tibial artery pedal pulses are 2/4 bilateral with immedate capillary fill time. Pedal hair growth present. No varicosities and no lower extremity edema present bilateral.   Neruologic: Grossly intact via light touch bilateral. Vibratory intact via tuning fork bilateral. Protective threshold  with Semmes Wienstein monofilament intact to all pedal sites bilateral. Patellar and Achilles deep tendon reflexes 2+ bilateral. No Babinski or clonus noted bilateral.   Musculoskeletal: No gross boney pedal deformities bilateral. No pain, crepitus, or limitation noted with foot and ankle range of motion bilateral. Muscular strength 5/5 in all groups tested bilateral.  Gait: Unassisted, Nonantalgic.    Radiographs:  None taken  Assessment & Plan:   Assessment: Idiopathic neuropathy with autonomic changes.  Plan: Referred her to a different neurology group.     Alejandra Preston T. [place], [place]
